# Patient Record
Sex: Female | Born: 1935 | Race: White | Hispanic: No | Smoking: Former smoker
Health system: Southern US, Community
[De-identification: ages and names within clinical notes are randomized; demographics above are authoritative.]

## PROBLEM LIST (undated history)

## (undated) DIAGNOSIS — E079 Disorder of thyroid, unspecified: Secondary | ICD-10-CM

## (undated) DIAGNOSIS — H04123 Dry eye syndrome of bilateral lacrimal glands: Secondary | ICD-10-CM

## (undated) DIAGNOSIS — H5462 Unqualified visual loss, left eye, normal vision right eye: Secondary | ICD-10-CM

## (undated) DIAGNOSIS — R519 Headache, unspecified: Secondary | ICD-10-CM

## (undated) DIAGNOSIS — J45909 Unspecified asthma, uncomplicated: Secondary | ICD-10-CM

## (undated) DIAGNOSIS — R51 Headache: Secondary | ICD-10-CM

## (undated) DIAGNOSIS — H409 Unspecified glaucoma: Secondary | ICD-10-CM

## (undated) HISTORY — PX: CHOLECYSTECTOMY: SHX55

## (undated) HISTORY — PX: TRABECULECTOMY: SHX107

---

## 2017-04-05 ENCOUNTER — Emergency Department (HOSPITAL_BASED_OUTPATIENT_CLINIC_OR_DEPARTMENT_OTHER): Payer: Medicare Other

## 2017-04-05 ENCOUNTER — Encounter (HOSPITAL_BASED_OUTPATIENT_CLINIC_OR_DEPARTMENT_OTHER): Payer: Self-pay | Admitting: Emergency Medicine

## 2017-04-05 ENCOUNTER — Emergency Department (HOSPITAL_BASED_OUTPATIENT_CLINIC_OR_DEPARTMENT_OTHER)
Admission: EM | Admit: 2017-04-05 | Discharge: 2017-04-05 | Disposition: A | Discharge status: Home / Self Care | Payer: Medicare Other | Attending: Emergency Medicine | Admitting: Emergency Medicine

## 2017-04-05 DIAGNOSIS — Y999 Unspecified external cause status: Secondary | ICD-10-CM | POA: Diagnosis not present

## 2017-04-05 DIAGNOSIS — Z87891 Personal history of nicotine dependence: Secondary | ICD-10-CM | POA: Diagnosis not present

## 2017-04-05 DIAGNOSIS — Z7982 Long term (current) use of aspirin: Secondary | ICD-10-CM | POA: Diagnosis not present

## 2017-04-05 DIAGNOSIS — S4992XA Unspecified injury of left shoulder and upper arm, initial encounter: Secondary | ICD-10-CM | POA: Diagnosis present

## 2017-04-05 DIAGNOSIS — S0181XA Laceration without foreign body of other part of head, initial encounter: Secondary | ICD-10-CM | POA: Diagnosis not present

## 2017-04-05 DIAGNOSIS — S42242A 4-part fracture of surgical neck of left humerus, initial encounter for closed fracture: Secondary | ICD-10-CM | POA: Insufficient documentation

## 2017-04-05 DIAGNOSIS — Y9301 Activity, walking, marching and hiking: Secondary | ICD-10-CM | POA: Diagnosis not present

## 2017-04-05 DIAGNOSIS — J45909 Unspecified asthma, uncomplicated: Secondary | ICD-10-CM | POA: Insufficient documentation

## 2017-04-05 DIAGNOSIS — Z79899 Other long term (current) drug therapy: Secondary | ICD-10-CM | POA: Diagnosis not present

## 2017-04-05 DIAGNOSIS — S01511A Laceration without foreign body of lip, initial encounter: Secondary | ICD-10-CM | POA: Insufficient documentation

## 2017-04-05 DIAGNOSIS — W19XXXA Unspecified fall, initial encounter: Secondary | ICD-10-CM

## 2017-04-05 DIAGNOSIS — Y92520 Airport as the place of occurrence of the external cause: Secondary | ICD-10-CM | POA: Insufficient documentation

## 2017-04-05 DIAGNOSIS — T148XXA Other injury of unspecified body region, initial encounter: Secondary | ICD-10-CM

## 2017-04-05 DIAGNOSIS — W01198A Fall on same level from slipping, tripping and stumbling with subsequent striking against other object, initial encounter: Secondary | ICD-10-CM | POA: Insufficient documentation

## 2017-04-05 HISTORY — DX: Unqualified visual loss, left eye, normal vision right eye: H54.62

## 2017-04-05 HISTORY — DX: Headache: R51

## 2017-04-05 HISTORY — DX: Dry eye syndrome of bilateral lacrimal glands: H04.123

## 2017-04-05 HISTORY — DX: Unspecified glaucoma: H40.9

## 2017-04-05 HISTORY — DX: Disorder of thyroid, unspecified: E07.9

## 2017-04-05 HISTORY — DX: Headache, unspecified: R51.9

## 2017-04-05 HISTORY — DX: Unspecified asthma, uncomplicated: J45.909

## 2017-04-05 MED ORDER — ACETAMINOPHEN 325 MG PO TABS
650.0000 mg | ORAL_TABLET | Freq: Once | ORAL | Status: AC
  Administered 2017-04-05: 650 mg via ORAL
  Filled 2017-04-05: qty 2

## 2017-04-05 MED ORDER — HYDROCODONE-ACETAMINOPHEN 5-325 MG PO TABS: 1.0000 | ORAL_TABLET | Freq: Four times a day (QID) | ORAL | 0 refills | Status: AC | PRN

## 2017-04-05 NOTE — Discharge Instructions (Addendum)
Take Tylenol for pain as needed Take Norco for pain if it is severe (do not take with Tylenol) Follow up with Orthopedics next week. Stay in sling unless showering. Keep wound on chin covered with a bandage until woundseal falls off (about one week). Change bandage daily. Do not get wet for 24 hours. Do not scrub or pick at area - it will fall off on its own. Return for worsening symptoms

## 2017-04-05 NOTE — ED Provider Notes (Signed)
[facility] Provider Note   CSN: [id number] Arrival date & time: 04/05/17  1548  By signing my name below, I, Bing Neighbors., attest that this documentation has been prepared under the direction and in the presence of Terance Hart, PA-C. Electronically signed: Bing Neighbors., ED Scribe. 04/05/17. 4:32 PM.   History   Chief Complaint Chief Complaint  Patient presents with  . Fall    HPI Carla Valentine is a 82 y.o. female who presents to the Emergency Department of constant, mild to moderate, aching L arm pain with onset x1 hour s/p mechanical fall. Pt is from [place], [place] and states that after getting off the airplane x1 hour ago, her shoes got caught on the cement causing her to fall forward. She reportedly fell on the L side and has associated L arm pain. She states that upon falling she hit her upper lip on the cement and sustained a laceration on the inside of the upper lip and chin. She denies any modifying factors and states that the pain is worsened with palpation and movement. Pt denies headache, LOC, neck pain, back pain, SOB, chest pain, R sided pain, hip pain, lower extremity pain. Of note, pt takes 81 mg aspirin daily.   The history is provided by the patient. No language interpreter was used.    Past Medical History:  Diagnosis Date  . Asthma   . Dry eyes   . Frequent headaches   . Glaucoma   . Thyroid disease   . Vision loss of left eye     There are no active problems to display for this patient.   Past Surgical History:  Procedure Laterality Date  . CHOLECYSTECTOMY    . TRABECULECTOMY      OB History    No data available       Home Medications    Prior to Admission medications   Medication Sig Start Date End Date Taking? Authorizing Provider  amitriptyline (ELAVIL) 50 MG tablet Take 50 mg by mouth at bedtime.   Yes [provider]  amLODipine (NORVASC) 5 MG tablet Take 5 mg by mouth daily.   Yes [provider]  aspirin EC 81 MG tablet Take 81 mg by mouth daily.   Yes [provider]  atorvastatin (LIPITOR) 20 MG tablet Take 20 mg by mouth daily.   Yes [provider]  cholecalciferol (VITAMIN D) 1000 units tablet Take 2,000 Units by mouth daily.   Yes [provider]  cycloSPORINE (RESTASIS) 0.05 % ophthalmic emulsion Place 1 drop into the right eye 2 (two) times daily.   Yes [provider]  levothyroxine (SYNTHROID, LEVOTHROID) 25 MCG tablet Take 25 mcg by mouth daily before breakfast.   Yes [provider]  salmeterol (SEREVENT) 50 MCG/DOSE diskus inhaler Inhale 1 puff into the lungs 2 (two) times daily.   Yes [provider]    Family History No family history on file.  Social History Social History  Substance Use Topics  . Smoking status: Former Games developer  . Smokeless tobacco: Never Used  . Alcohol use Yes     Comment: rarely     Allergies   Patient has no known allergies.   Review of Systems Review of Systems  Respiratory: Negative for shortness of breath.   Cardiovascular: Negative for chest pain.  Gastrointestinal: Negative for abdominal pain.  Musculoskeletal: Positive for arthralgias (L arm). Negative for back pain and neck pain.  Skin: Positive for color  change (bruising) and wound (chin, inner upper lip).  Neurological: Negative for syncope and headaches.  All other systems reviewed and are negative.    Physical Exam Updated Vital Signs BP 122/73 (BP Location: Right Arm)   Pulse 66   Temp 98.1 F (36.7 C) (Oral)   Resp 16   Ht 5\' 2"  (1.575 m)   Wt 125 lb (56.7 kg)   SpO2 96% Comment: Simultaneous filing. User may not have seen previous data.  BMI 22.86 kg/m   Physical Exam  Constitutional: She is oriented to person, place, and time. She appears well-developed and well-nourished. No distress.  HENT:  Head: Normocephalic. Head is without raccoon's eyes and without Battle's sign.  Mouth/Throat:  Oropharynx is clear and moist. Lacerations (Bruising of the upper lip with a laceration on the inside of the upper lip.) present.   No loose teeth.   Eyes: Conjunctivae are normal. Pupils are equal, round, and reactive to light. Right eye exhibits no discharge. Left eye exhibits no discharge. No scleral icterus.  Neck: Normal range of motion. Neck supple.  No midline tenderness.  Cardiovascular: Normal rate and regular rhythm.   No murmur heard. Pulmonary/Chest: Effort normal and breath sounds normal. No respiratory distress. She exhibits no tenderness.  No chest tenderness.  Abdominal: Soft. She exhibits no distension. There is no tenderness.  No abdominal tenderness  Musculoskeletal: She exhibits no edema.       Left shoulder: She exhibits no tenderness.       Left elbow: No tenderness found.       Left upper arm: She exhibits tenderness.  L upper extremity: No shoulder tenderness. ROM deferred. She has tenderness of the arm. No tenderness of the elbow. Full ROM of the elbow, neurovascularly intact.   No lower extremity tenderness. Full ROM of lower extremities.  Neurological: She is alert and oriented to person, place, and time.  Skin: Skin is warm and dry. Laceration noted.  She has a small laceration under the chin approximately 0.5 cm.   Psychiatric: She has a normal mood and affect. Her behavior is normal.  Nursing note and vitals reviewed.    ED Treatments / Results   DIAGNOSTIC STUDIES: Oxygen Saturation is 96% on RA, adequate by my interpretation.   COORDINATION OF CARE: 4:34 PM-Discussed next steps with pt. Pt verbalized understanding and is agreeable with the plan.    Labs (all labs ordered are listed, but only abnormal results are displayed) Labs Reviewed - No data to display  EKG  EKG Interpretation None       Radiology No results found.  Procedures Procedures (including critical care time)  Medications Ordered in ED Medications - No data to  display   Initial Impression / Assessment and Plan / ED Course  I have reviewed the triage vital signs and the nursing notes.  Pertinent labs & imaging results that were available during my care of the patient were reviewed by me and considered in my medical decision making (see chart for details).  82 year old female with L humerus fx and lacerations s/p mechanical fall. Vitals are normal. CT head, neck, maxillofacial are unremarkable. Lacerations are minimal and do not warrant repair. Chin lac is oozing blood after a couple hours in the ED so WoundSeal was applied and hemostasis was achieved. Discussed imaging findings with patient and family. She was placed in shoulder immobilizer and advised to follow up with orthopedics at home. She verbalized understanding. Discussed wound care as well. Pain medication  rx given. Return precautions given.  Shared visit with Dr. Fredderick Phenix who is in agreement with plan.  Final Clinical Impressions(s) / ED Diagnoses   Final diagnoses:  Fall, initial encounter  Lip laceration, initial encounter  Chin laceration, initial encounter  4-part fracture of surgical neck of left humerus, initial encounter for closed fracture    New Prescriptions New Prescriptions   No medications on file   I personally performed the services described in this documentation, which was scribed in my presence. The recorded information has been reviewed and is accurate.     Bethel Born, PA-C 04/05/17 1610    Rolan Bucco, MD 04/05/17 [phone]

## 2017-04-05 NOTE — ED Triage Notes (Signed)
Pt sts her rubber sole shoe [place] airport.  No LOC. Denies dizziness.  Pain to upper left arm.  Lac to chin and inside upper lip.

## 2017-04-05 NOTE — ED Notes (Signed)
Patient transported to CT 

## 2018-04-25 IMAGING — CT CT MAXILLOFACIAL W/O CM
3 of 11 series · 15 of 47 positions shown, 17 images · non-contrast
Comparison: None.

CLINICAL DATA: Post fall with bruising and laceration to the upper
lip and chin.

EXAM:
CT HEAD WITHOUT CONTRAST
CT MAXILLOFACIAL WITHOUT CONTRAST
CT CERVICAL SPINE WITHOUT CONTRAST
TECHNIQUE: Contiguous axial images were obtained from the base of the skull
through the vertex without intravenous contrast. Multidetector CT
imaging of the maxillofacial structures was performed. Multiplanar
CT image reconstructions were also generated. A small metallic BB
was placed on the right temple in order to reliably differentiate
right from left. Multidetector CT imaging of the cervical spine was
performed without intravenous contrast. Multiplanar CT image
reconstructions were also generated.

[Series 4: head coronal · coronal · 0.27mm/px · 1 of 64 slices shown]
[im 32/64  bone]
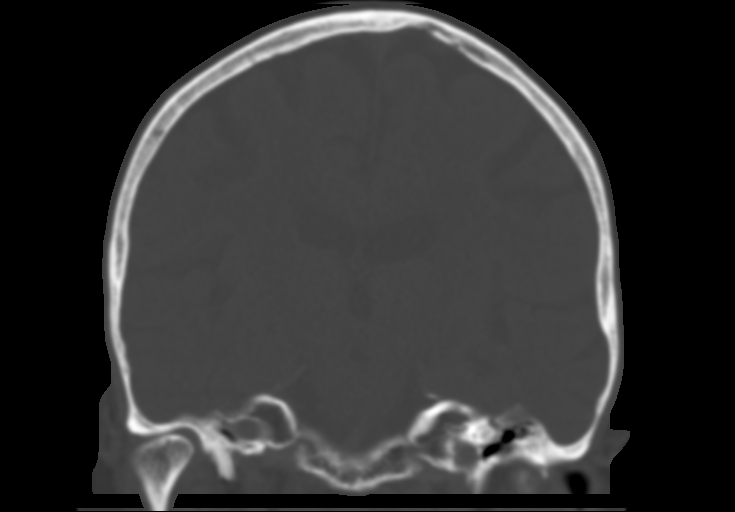

[Series 15: c spine soft · axial · 0.28mm/px · z∈[+1112,+1212]mm · 6 of 80 slices shown]
[im 10/80  brain]
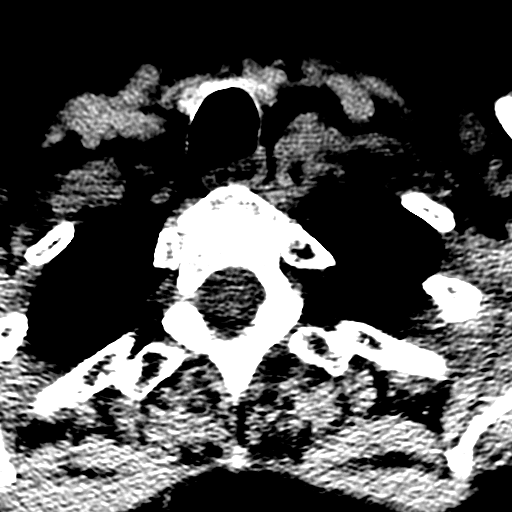
[im 20/80  brain]
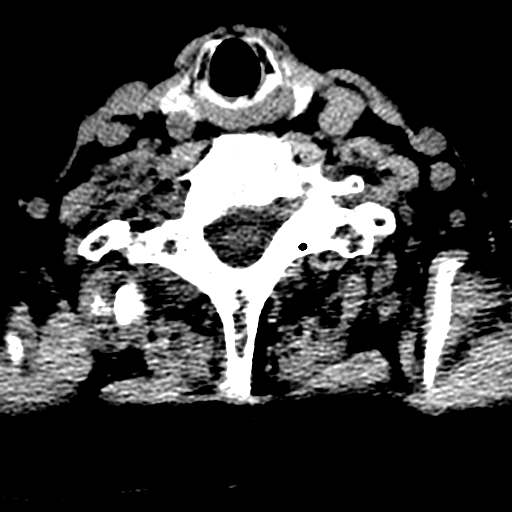
[im 30/80  brain]
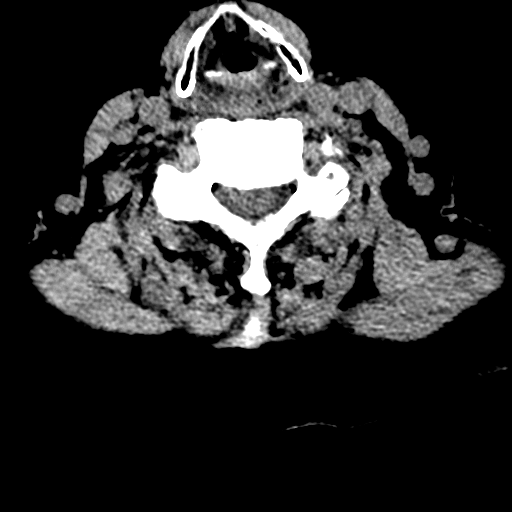
[im 40/80  brain]
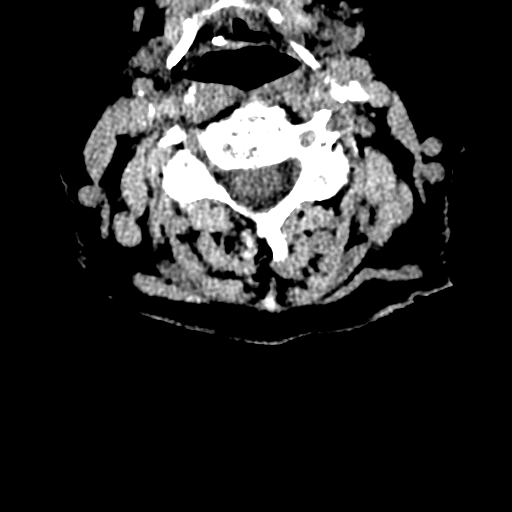
[im 50/80  brain]
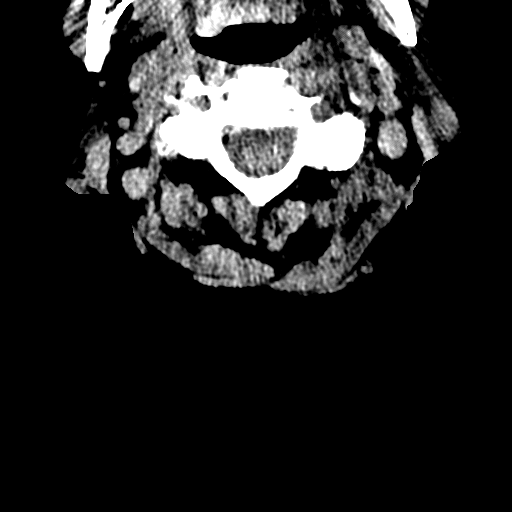
[im 60/80  brain]
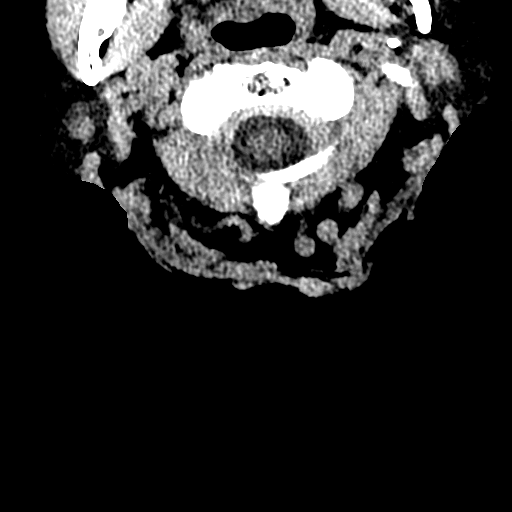

[Series 19: orthogonal axials · axial · 0.23mm/px · z∈[+1094,+1232]mm · 8 of 92 slices shown, 10 images]
[im 11/92  brain]
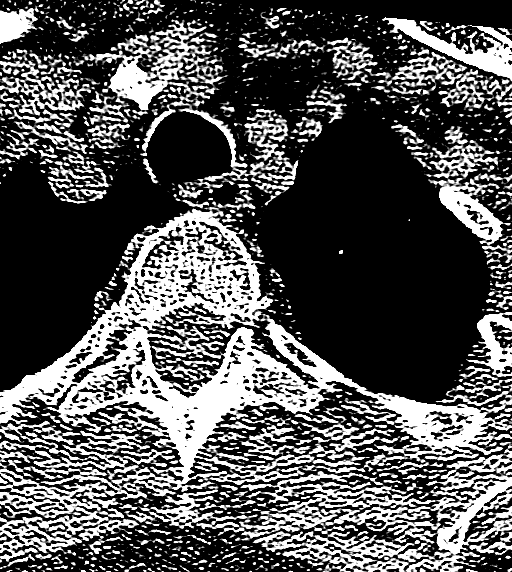
[im 11/92  bone]
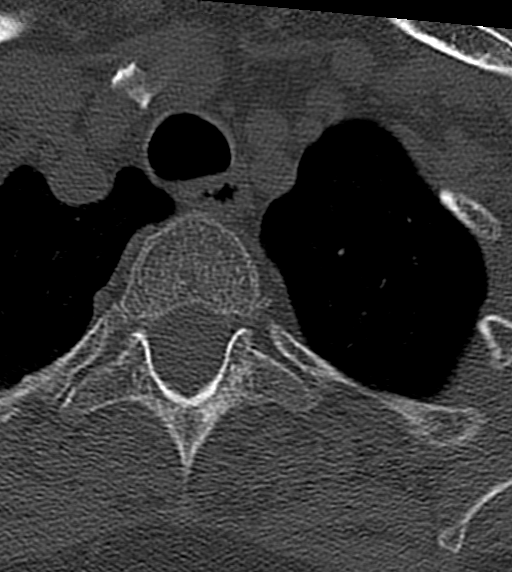
[im 21/92  bone]
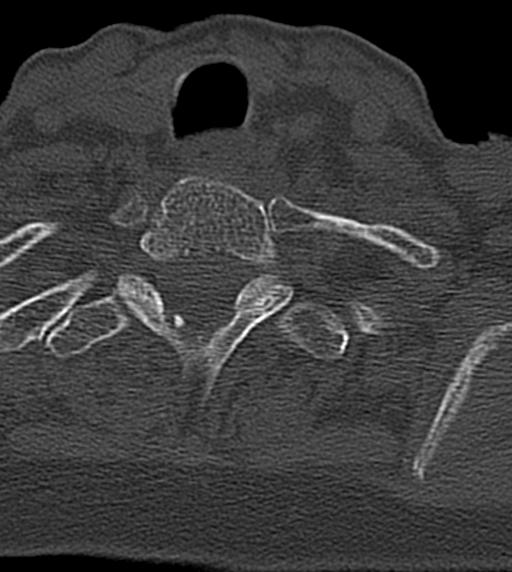
[im 31/92  bone]
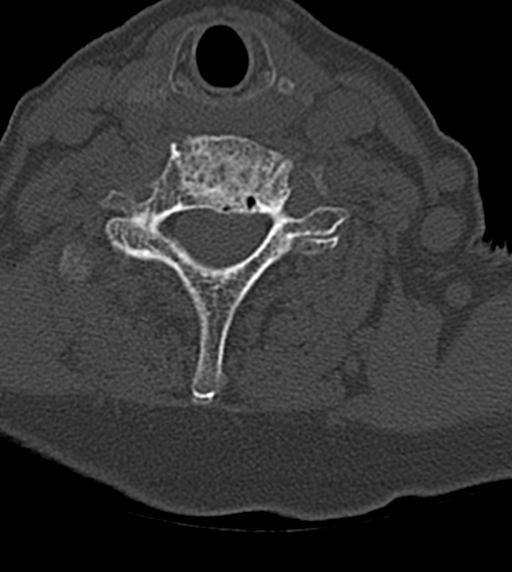
[im 41/92  bone]
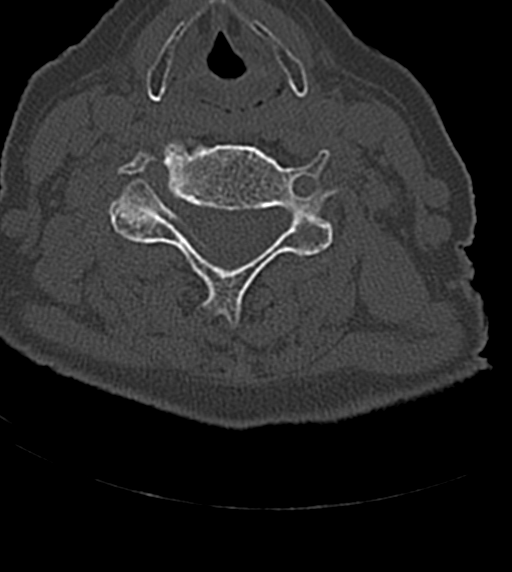
[im 51/92  brain]
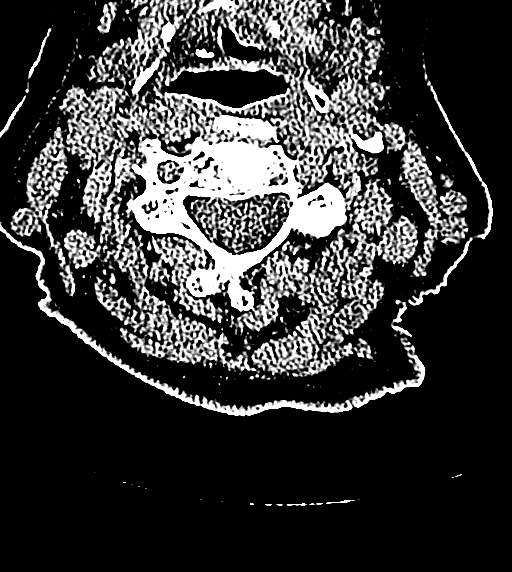
[im 51/92  bone]
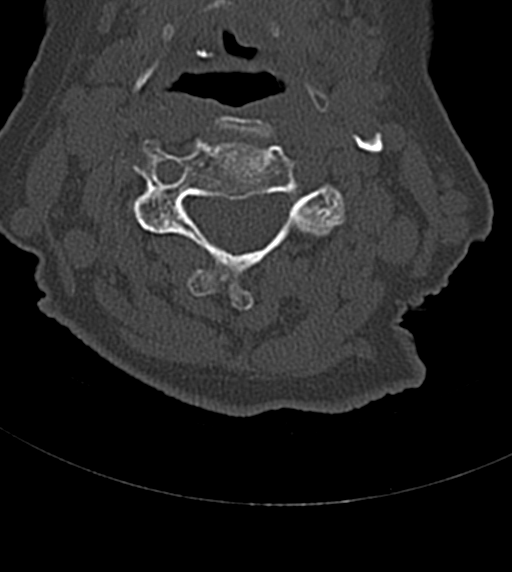
[im 61/92  bone]
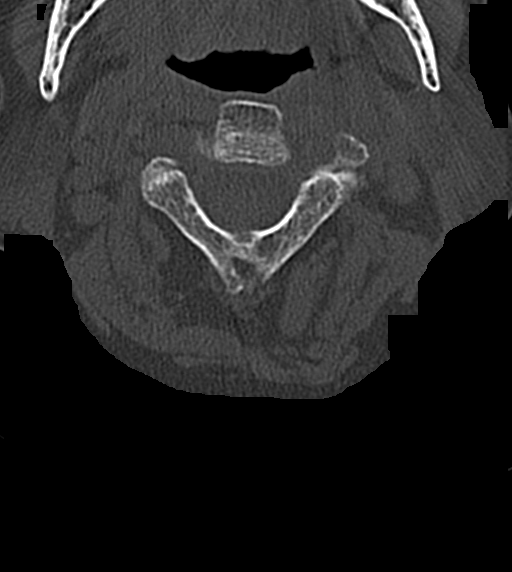
[im 71/92  bone]
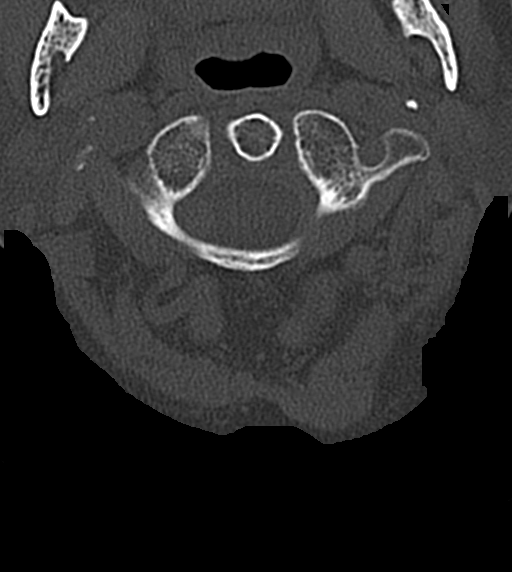
[im 81/92  bone]
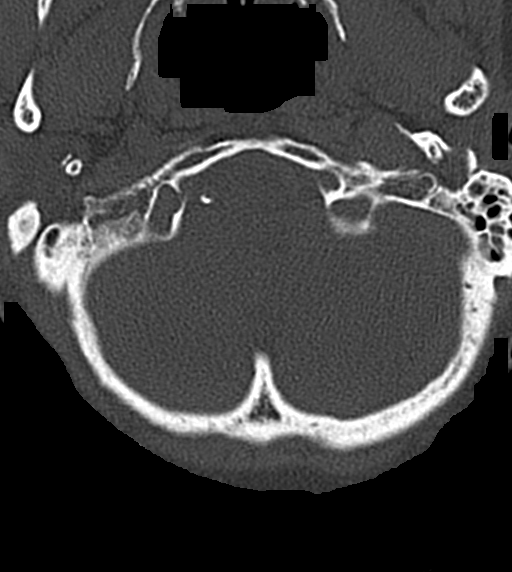

[15 of 47 positions shown; findings below may reference images not displayed]

FINDINGS: CT HEAD FINDINGS

Brain: No evidence of acute infarction, hemorrhage, hydrocephalus,
extra-axial collection or mass lesion/mass effect. Moderate brain
parenchymal volume loss and periventricular microangiopathy.

Vascular: Calcific atherosclerotic disease at the skullbase.

Skull: Normal. Negative for fracture or focal lesion.

CT MAXILLOFACIAL FINDINGS

Osseous: No fracture or mandibular dislocation. No destructive
process.

Orbits: Negative. No traumatic or inflammatory finding.

Sinuses: Clear.

Soft tissues: Mild soft tissue swelling of the chin.

CT CERVICAL SPINE FINDINGS

Alignment: 3 mm anterolisthesis of C3 on C4, likely due to
degenerative facet arthropathy.

Skull base and vertebrae: No acute fracture. No primary bone lesion
or focal pathologic process.

Soft tissues and spinal canal: No prevertebral fluid or swelling. No
visible canal hematoma.

Disc levels: Multilevel osteoarthritic changes, moderate-to-severe,
with disc space narrowing, endplate sclerosis, vertebral body
remodeling and osteophyte formation. Moderate to severe posterior
facet arthropathy.

Upper chest: Negative.

Other: None.
IMPRESSION: No acute intracranial abnormality.

Moderate brain parenchymal atrophy and chronic microvascular
disease.

No evidence of osseous trauma to the face. Mild soft tissue swelling
of the chin.

No evidence of acute trauma to the cervical spine.

Advanced multilevel osteoarthritic changes and posterior facet
arthropathy, with 3 mm of anterolisthesis of C3 on C4.

## 2020-05-24 DEATH — deceased
